# Patient Record
Sex: Female | Born: 1975 | Race: White | Hispanic: No | Marital: Single | Smoking: Never smoker
Health system: Southern US, Community
[De-identification: ages and names within clinical notes are randomized; demographics above are authoritative.]

## PROBLEM LIST (undated history)

## (undated) DIAGNOSIS — R42 Dizziness and giddiness: Secondary | ICD-10-CM

## (undated) DIAGNOSIS — N200 Calculus of kidney: Secondary | ICD-10-CM

---

## 2007-12-26 ENCOUNTER — Ambulatory Visit (HOSPITAL_COMMUNITY): Admission: RE | Admit: 2007-12-26 | Discharge: 2007-12-26 | Payer: Self-pay | Admitting: Obstetrics and Gynecology

## 2009-04-03 ENCOUNTER — Ambulatory Visit: Payer: Self-pay | Admitting: Family Medicine

## 2010-11-01 ENCOUNTER — Ambulatory Visit: Payer: Self-pay | Admitting: Emergency Medicine

## 2011-01-23 NOTE — Letter (Signed)
Summary: Out of Work  [facility]  [address]   [place], [place] [address]   Phone: [phone]  Fax: [phone]    November 01, 2010   Employee:  Chyann HACKWORTH    To Whom It May Concern:   For Medical reasons, please excuse the above named employee from work for the following dates:  Start:   11/01/10  End:   11/02/10  If you need additional information, please feel free to contact our office.         Sincerely,    Hoyt Koch MD

## 2011-01-23 NOTE — Assessment & Plan Note (Signed)
Summary: EARS POPPING, DIZZY/TJ   Vital Signs:  Patient Profile:   35 Years Old Female CC:      Dizzy,  nausea, sinus drainage, sore throat x 5 days Height:     62 inches O2 Sat:      100 % O2 treatment:    Room Air Temp:     97.6 degrees F oral Pulse rate:   77 / minute Pulse rhythm:   regular Resp:     14 per minute BP sitting:   131 / 86  (left arm) Cuff size:   regular  Vitals Entered By: Emilio Math (November 01, 2010 3:36 PM)                  Current Allergies (reviewed today): ! AMOXICILLIN ! * HAYFEVERHistory of Present Illness Chief Complaint: Dizzy,  nausea, sinus drainage, sore throat x 5 days History of Present Illness: URI symptoms for the last few days including sore throat, sinus drainage, cough.  Then today around lunchtime off of a sudden developed dizziness.  She has a history of vertigo and this feels very similar.  Worse when turning head and movement and bright lights.  Staying still helps.  She has taken Meclizine in the past that has helped.  +Nausea but no vomiting.  Also with a history of migraines and does have a HA today.  No F/C.  Not using any other OTC meds.  No numbness, weakness, tingling.  Current Meds IBUPROFEN 800 MG TABS (IBUPROFEN) 1 tab by mouth three times a day SUDAFED 30 MG TABS (PSEUDOEPHEDRINE HCL)  PROMETHAZINE HCL 12.5 MG TABS (PROMETHAZINE HCL) 1 tab by mouth Q6 hours as needed for nausea MECLIZINE HCL 25 MG TABS (MECLIZINE HCL) 1 tab by mouth Q6 hours as needed for dizziness  REVIEW OF SYSTEMS Constitutional Symptoms      Denies fever, chills, night sweats, weight loss, weight gain, and fatigue.  Eyes       Denies change in vision, eye pain, eye discharge, glasses, contact lenses, and eye surgery. Ear/Nose/Throat/Mouth       Complains of sinus problems.      Denies hearing loss/aids, change in hearing, ear pain, ear discharge, dizziness, frequent runny nose, frequent nose bleeds, sore throat, hoarseness, and tooth pain or  bleeding.  Respiratory       Denies dry cough, productive cough, wheezing, shortness of breath, asthma, bronchitis, and emphysema/COPD.  Cardiovascular       Denies murmurs, chest pain, and tires easily with exhertion.    Gastrointestinal       Complains of nausea/vomiting.      Denies stomach pain, diarrhea, constipation, blood in bowel movements, and indigestion. Genitourniary       Denies painful urination, kidney stones, and loss of urinary control. Neurological       Denies paralysis, seizures, and fainting/blackouts. Musculoskeletal       Denies muscle pain, joint pain, joint stiffness, decreased range of motion, redness, swelling, muscle weakness, and gout.  Skin       Denies bruising, unusual mles/lumps or sores, and hair/skin or nail changes.  Psych       Denies mood changes, temper/anger issues, anxiety/stress, speech problems, depression, and sleep problems.  Past History:  Past Medical History: Reviewed history from 04/03/2009 and no changes required. Unremarkable  Past Surgical History: Reviewed history from 04/03/2009 and no changes required. Denies surgical history  Family History: Reviewed history from 04/03/2009 and no changes required. 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 General appearance: well developed, well nourished, mild distress from photophobia Eyes: scleral irritation Ears: normal, no lesions or deformities Nasal: mucosa pink, nonedematous, no septal deviation, turbinates normal Oral/Pharynx: tongue normal, posterior pharynx without erythema or exudate Neck: neck supple,  trachea midline, no masses Chest/Lungs: no rales, wheezes, or rhonchi bilateral, breath sounds equal without effort Heart: regular rate and  rhythm, no  murmur Neurological: grossly intact and non-focal Skin: no obvious rashes or lesions MSE: oriented to time, place, and person Assessment New Problems: NAUSEA (ICD-787.02) BENIGN POSITIONAL VERTIGO (ICD-386.11)   Plan New Medications/Changes: MECLIZINE HCL 25 MG TABS (MECLIZINE HCL) 1 tab by mouth Q6 hours as needed for dizziness  #20 x 0, 11/01/2010, Hoyt Koch MD PROMETHAZINE HCL 12.5 MG TABS (PROMETHAZINE HCL) 1 tab by mouth Q6 hours as needed for nausea  #15 x 0, 11/01/2010, Hoyt Koch MD  New Orders: Est. Patient Level III [phone] Planning Comments:   Use the nausea meds and the Meclizine as instructed.  No ABX prescribed today since this was likely viral.  If getting worse, can then try Prednisone.  However, gave a work note for the patient to go home, rest, hydration.  Migraine meds as needed.  Follow-up with your primary care physician if not improving or if getting worse.  If much worse, go to ER   The patient and/or caregiver has been counseled thoroughly with regard to medications prescribed including dosage, schedule, interactions, rationale for use, and possible side effects and they verbalize understanding.  Diagnoses and expected course of recovery discussed and will return if not improved as expected or if the condition worsens. Patient and/or caregiver verbalized understanding.  Prescriptions: MECLIZINE HCL 25 MG TABS (MECLIZINE HCL) 1 tab by mouth Q6 hours as needed for dizziness  #20 x 0   Entered and Authorized by:   Hoyt Koch MD   Signed by:   Hoyt Koch MD on 11/01/2010   Method used:   Print then Give to Patient   RxID:   [id number] PROMETHAZINE HCL 12.5 MG TABS (PROMETHAZINE HCL) 1 tab by mouth Q6 hours as needed for nausea  #15 x 0   Entered and Authorized by:   Hoyt Koch MD   Signed by:   Hoyt Koch MD on 11/01/2010   Method used:   Print then Give to Patient   RxID:   [id number]   Orders Added: 1)   Est. Patient Level III [32671]

## 2011-03-11 ENCOUNTER — Ambulatory Visit
Admission: RE | Admit: 2011-03-11 | Discharge: 2011-03-11 | Disposition: A | Discharge status: Home / Self Care | Payer: BC Managed Care – PPO | Source: Ambulatory Visit | Attending: Family Medicine | Admitting: Family Medicine

## 2011-03-11 ENCOUNTER — Encounter: Payer: Self-pay | Admitting: Family Medicine

## 2011-03-11 ENCOUNTER — Other Ambulatory Visit: Payer: Self-pay | Admitting: Family Medicine

## 2011-03-11 ENCOUNTER — Inpatient Hospital Stay (INDEPENDENT_AMBULATORY_CARE_PROVIDER_SITE_OTHER)
Admission: RE | Admit: 2011-03-11 | Discharge: 2011-03-11 | Disposition: A | Discharge status: Home / Self Care | Payer: BC Managed Care – PPO | Source: Ambulatory Visit | Attending: Family Medicine | Admitting: Family Medicine

## 2011-03-11 DIAGNOSIS — M79673 Pain in unspecified foot: Secondary | ICD-10-CM

## 2011-03-11 DIAGNOSIS — M21959 Unspecified acquired deformity of unspecified thigh: Secondary | ICD-10-CM

## 2011-03-11 DIAGNOSIS — M7750 Other enthesopathy of unspecified foot: Secondary | ICD-10-CM

## 2011-03-11 DIAGNOSIS — M79609 Pain in unspecified limb: Secondary | ICD-10-CM

## 2011-03-22 NOTE — Assessment & Plan Note (Signed)
Summary: foot injury/TM rm 4   Vital Signs:  Patient Profile:   35 Years Old Female CC:      LT foot injury Height:     62 inches Weight:      136.50 pounds O2 Sat:      100 % O2 treatment:    Room Air Temp:     99.0 degrees F oral Pulse rate:   82 / minute Resp:     16 per minute BP sitting:   126 / 81  (left arm) Cuff size:   regular  Vitals Entered By: Clemens Catholic LPN (March 11, 2011 11:27 AM)                  Updated Prior Medication List: No Medications Current Allergies (reviewed today): ! AMOXICILLIN ! * HAYFEVERHistory of Present Illness Chief Complaint: LT foot injury History of Present Illness:  Subjective:  Patient complains of two day history of pain in lateral edge of left foot, worse with ambulation/weight bearing.  Recalls no injury, but has been doing yoga.  Poor response to Ibuprofen.  REVIEW OF SYSTEMS Constitutional Symptoms      Denies fever, chills, night sweats, weight loss, weight gain, and fatigue.  Eyes       Denies change in vision, eye pain, eye discharge, glasses, contact lenses, and eye surgery. Ear/Nose/Throat/Mouth       Denies hearing loss/aids, change in hearing, ear pain, ear discharge, dizziness, frequent runny nose, frequent nose bleeds, sinus problems, sore throat, hoarseness, and tooth pain or bleeding.  Respiratory       Denies dry cough, productive cough, wheezing, shortness of breath, asthma, bronchitis, and emphysema/COPD.  Cardiovascular       Denies murmurs, chest pain, and tires easily with exhertion.    Gastrointestinal       Denies stomach pain, nausea/vomiting, diarrhea, constipation, blood in bowel movements, and indigestion. Genitourniary       Denies painful urination, kidney stones, and loss of urinary control. Neurological       Denies paralysis, seizures, and fainting/blackouts. Musculoskeletal       Complains of decreased range of motion.      Denies muscle pain, joint pain, joint stiffness, redness,  swelling, muscle weakness, and gout.  Skin       Complains of bruising.      Denies unusual mles/lumps or sores and hair/skin or nail changes.  Psych       Denies mood changes, temper/anger issues, anxiety/stress, speech problems, depression, and sleep problems. Other Comments: pt c/o LT foot pain and bruising x 2days. Hurts to bare weight on that foot. no injury. she did start doing Yoga 2 wks ago. she taken IBF  and applied ice with no relief.   Past History:  Past Medical History: Reviewed history from 04/03/2009 and no changes required. Unremarkable  Past Surgical History: Reviewed history from 04/03/2009 and no changes required. Denies surgical history  Family History: Reviewed history from 04/03/2009 and no changes required. mother age 28 alive - high blood pressure - high cholesterol father age 27 alive - high blood pressure - high cholesterol brother age 74 alive and healthy  Social History: Reviewed history from 11/01/2010 and no changes required. 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  No ecchymosis.  Vague mild tenderness over 5th metatarsal.  All toes have full range  of motion.  Distal neurovascular intact  X-ray left foot:  IMPRESSION: No acute bony pathology. Assessment New Problems: METATARSALGIA (ICD-726.70) FOOT PAIN, LEFT (ICD-729.5)   Plan New Orders: T-DG Foot Complete*L* [73630] Est. Patient Level III [60737] Planning Comments:   Recommend Ibuprofen 200mg , 4 tabs every 8 hours with food.  Obtain well fitting arch supports for shoes.  ([facility] information and instruction patient handout given)  Followup with [facility] if not improved in two weeks.   The patient and/or caregiver has been counseled thoroughly with regard to medications prescribed including dosage, schedule,  interactions, rationale for use, and possible side effects and they verbalize understanding.  Diagnoses and expected course of recovery discussed and will return if not improved as expected or if the condition worsens. Patient and/or caregiver verbalized understanding.   Orders Added: 1)  T-DG Foot Complete*L* [73630] 2)  Est. Patient Level III [10626]

## 2011-07-22 ENCOUNTER — Other Ambulatory Visit: Payer: Self-pay | Admitting: Emergency Medicine

## 2011-07-22 ENCOUNTER — Inpatient Hospital Stay (INDEPENDENT_AMBULATORY_CARE_PROVIDER_SITE_OTHER)
Admission: RE | Admit: 2011-07-22 | Discharge: 2011-07-22 | Disposition: A | Discharge status: Home / Self Care | Payer: BC Managed Care – PPO | Source: Ambulatory Visit | Attending: Emergency Medicine | Admitting: Emergency Medicine

## 2011-07-22 ENCOUNTER — Encounter: Payer: Self-pay | Admitting: Emergency Medicine

## 2011-07-22 ENCOUNTER — Ambulatory Visit
Admission: RE | Admit: 2011-07-22 | Discharge: 2011-07-22 | Disposition: A | Discharge status: Home / Self Care | Payer: BC Managed Care – PPO | Source: Ambulatory Visit | Attending: Emergency Medicine | Admitting: Emergency Medicine

## 2011-07-22 DIAGNOSIS — M25529 Pain in unspecified elbow: Secondary | ICD-10-CM

## 2011-11-17 ENCOUNTER — Emergency Department
Admit: 2011-11-17 | Discharge: 2011-11-17 | Disposition: A | Discharge status: Home / Self Care | Payer: BC Managed Care – PPO

## 2011-11-17 ENCOUNTER — Emergency Department
Admission: EM | Admit: 2011-11-17 | Discharge: 2011-11-17 | Disposition: A | Discharge status: Home / Self Care | Payer: BC Managed Care – PPO | Source: Home / Self Care | Attending: Family Medicine | Admitting: Family Medicine

## 2011-11-17 DIAGNOSIS — R05 Cough: Secondary | ICD-10-CM

## 2011-11-17 DIAGNOSIS — J209 Acute bronchitis, unspecified: Secondary | ICD-10-CM

## 2011-11-17 DIAGNOSIS — R058 Other specified cough: Secondary | ICD-10-CM

## 2011-11-17 MED ORDER — GUAIFENESIN-CODEINE 100-10 MG/5ML PO SYRP: ORAL_SOLUTION | ORAL | Status: AC

## 2011-11-17 MED ORDER — CLARITHROMYCIN 500 MG PO TABS: 500.0000 mg | ORAL_TABLET | Freq: Two times a day (BID) | ORAL | Status: AC

## 2011-11-17 NOTE — ED Notes (Signed)
States she has had a cough since 10/28/2011 now is having nasal congestion.

## 2011-11-17 NOTE — ED Provider Notes (Signed)
History     CSN: [id number] Arrival date & time: 11/17/2011  2:21 PM   First MD Initiated Contact with Patient 11/17/11 1432      Chief Complaint  Patient presents with  . URI      HPI Comments: Patient complains of mild cold like symptoms that started about a month ago, and now has developed a persistent non-productive cough.  She often coughs until she gags.  Yesterday she developed increased sinus congestion with chills/sweats.  She has had increased anterior chest discomfort over the past two days.  Patient is a 35 y.o. female presenting with cough. The history is provided by the patient.  Cough This is a new problem. The current episode started more than 1 week ago. The problem occurs hourly. The problem has been gradually worsening. The cough is non-productive. There has been no fever. Associated symptoms include chills, sweats, weight loss and rhinorrhea. Pertinent negatives include no chest pain, no ear congestion, no ear pain, no headaches, no sore throat, no myalgias, no shortness of breath, no wheezing and no eye redness. She has tried cough syrup for the symptoms. The treatment provided no relief. She is not a smoker.    History reviewed. No pertinent past medical history.  History reviewed. No pertinent past surgical history.  Family History  Problem Relation Age of Onset  . Diabetes Other   . Hypertension Other     History  Substance Use Topics  . Smoking status: Never Smoker   . Smokeless tobacco: Not on file  . Alcohol Use: No    OB History    Grav Para Term Preterm Abortions TAB SAB Ect Mult Living                  Review of Systems  Constitutional: Positive for fever, chills, weight loss, fatigue and unexpected weight change.  HENT: Positive for congestion, rhinorrhea and postnasal drip. Negative for ear pain, sore throat, neck stiffness, sinus pressure and ear discharge.   Eyes: Negative for redness.  Respiratory: Positive for cough and chest  tightness. Negative for shortness of breath and wheezing.   Cardiovascular: Negative for chest pain and leg swelling.  Gastrointestinal: Negative.   Genitourinary: Negative.   Musculoskeletal: Negative for myalgias.  Skin: Negative.   Neurological: Negative for headaches.    Allergies  Amoxicillin  Home Medications   Current Outpatient Rx  Name Route Sig Dispense Refill  . CLARITHROMYCIN 500 MG PO TABS Oral Take 1 tablet (500 mg total) by mouth 2 (two) times daily. 20 tablet 0  . GUAIFENESIN-CODEINE 100-10 MG/5ML PO SYRP  Take 5 to 10 ML by mouth at bedtime as needed for cough 120 mL 0    BP 115/82  Pulse 91  Temp(Src) 98.3 F (36.8 C) (Oral)  Resp 20  Ht 5\' 2"  (1.575 m)  Wt 129 lb (58.514 kg)  BMI 23.59 kg/m2  SpO2 97%  LMP 10/13/2011  Physical Exam  Nursing note and vitals reviewed. Constitutional: She is oriented to person, place, and time. She appears well-developed and well-nourished. No distress.  HENT:  Head: Normocephalic and atraumatic.  Right Ear: Tympanic membrane and external ear normal.  Left Ear: Tympanic membrane and external ear normal.  Nose: Nose normal.  Mouth/Throat: Oropharynx is clear and moist. No oropharyngeal exudate.  Eyes: Conjunctivae and EOM are normal. Pupils are equal, round, and reactive to light. Right eye exhibits no discharge. Left eye exhibits no discharge. No scleral icterus.  Neck: Neck supple.  Cardiovascular:  Normal rate, regular rhythm and normal heart sounds.   Pulmonary/Chest: Effort normal. No respiratory distress. She has no wheezes. She has rhonchi. She has no rales. She exhibits no tenderness.  Abdominal: Bowel sounds are normal. There is no tenderness.  Musculoskeletal: She exhibits no edema and no tenderness.  Lymphadenopathy:    She has no cervical adenopathy.  Neurological: She is alert and oriented to person, place, and time.  Skin: Skin is warm and dry.    ED Course  Procedures None  Labs Reviewed - No data to  display Dg Chest 2 View  11/17/2011  *RADIOLOGY REPORT*  Clinical Data: Persistent cough for 1 month.  Nonsmoker.  CHEST - 2 VIEW  Comparison: None.  Findings: Lateral view degraded by patient arm position.  Midline trachea.  Normal heart size and mediastinal contours. No pleural effusion or pneumothorax.  Clear lungs.  IMPRESSION: Normal chest.  Original Report Authenticated By: Consuello Bossier, M.D.     1. Acute bronchitis       MDM  ? Pertussis Begin Biaxin.  Begin expectorant/decongestant, topical decongestant, saline nasal spray and/or saline irrigation, and cough suppressant at bedtime.  Avoid antihistamines for now. Increase fluid intake, rest. Recommend Tdap and flu shot when well. Followup with PCP if not improving one week.          Donna Christen, MD 11/20/11 [phone]

## 2011-11-26 NOTE — Progress Notes (Signed)
Summary: elbow injury/TM(RM4   Vital Signs:  Patient Profile:   35 Years Old Female CC:      ELBOW INJURY Height:     62 inches Weight:      139 pounds O2 Sat:      100 % O2 treatment:    Room Air Temp:     98.5 degrees F oral Pulse rate:   74 / minute Resp:     16 per minute BP sitting:   128 / 88  (left arm) Cuff size:   regular  Vitals Entered By: Linton Flemings RN (July 22, 2011 5:45 PM)                  Updated Prior Medication List: No Medications Current Allergies (reviewed today): ! AMOXICILLIN ! * HAYFEVERHistory of Present Illness History from: patient Chief Complaint: ELBOW INJURY History of Present Illness: R elbow injury.  She is R handed.  She fell 2 days ago directly onto her elbow.  Since then has had pain and swelling.  Movement is still good but she reports tenderness to touch and swelling.  Ice is helping some.  No previous elbow injuries.  Pain is constant and throbbing, worse with movement.  Mild radiation to forearm yesterday but today is better.  REVIEW OF SYSTEMS Constitutional Symptoms      Denies fever, chills, night sweats, weight loss, weight gain, and fatigue.  Eyes       Denies change in vision, eye pain, eye discharge, glasses, contact lenses, and eye surgery. Ear/Nose/Throat/Mouth       Denies hearing loss/aids, change in hearing, ear pain, ear discharge, dizziness, frequent runny nose, frequent nose bleeds, sinus problems, sore throat, hoarseness, and tooth pain or bleeding.  Respiratory       Denies dry cough, productive cough, wheezing, shortness of breath, asthma, bronchitis, and emphysema/COPD.  Cardiovascular       Denies murmurs, chest pain, and tires easily with exhertion.    Gastrointestinal       Denies stomach pain, nausea/vomiting, diarrhea, constipation, blood in bowel movements, and indigestion. Genitourniary       Denies painful urination, kidney stones, and loss of urinary control. Neurological       Denies paralysis,  seizures, and fainting/blackouts. Musculoskeletal       Complains of decreased range of motion.      Denies muscle pain, joint pain, joint stiffness, redness, swelling, muscle weakness, and gout.  Skin       Complains of bruising.      Denies unusual mles/lumps or sores and hair/skin or nail changes.  Psych       Denies mood changes, temper/anger issues, anxiety/stress, speech problems, depression, and sleep problems. Other Comments: FELL ON FRIDAY, RIGHT ELBOW PAIN   Past History:  Past Medical History: Reviewed history from 04/03/2009 and no changes required. Unremarkable  Past Surgical History: Reviewed history from 04/03/2009 and no changes required. Denies surgical history  Family History: Reviewed history from 04/03/2009 and no changes required. mother age 41 alive - high blood pressure - high cholesterol father age 26 alive - high blood pressure - high cholesterol brother age 96 alive and healthy  Social History: Reviewed history from 11/01/2010 and no changes required. 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  Supination and pronation are slightly reduced as well.  There is a good amount of swelling/effusion throughout elbow joint.  She has no anterior or medial pain, but she does have significant tenderness posteriorly and lateral epicondyle.  Distal NV status intact. Assessment New Problems: ELBOW PAIN (ICD-719.42)   Plan New Orders: Est. Patient Level IV [14782] T-DG Elbow Complete*R* [73080] Arm Sling Universal [A4565] Planning Comments:   Elbow Xray is ordered and read by radiology as normal.  Given sling to wear.  From the exam, I was expecting a fracture, but instead is likely a bony contusion, r/o occult fracture.  If not improving with conservative  treatment in the next week, will refer her to sports medicine for follow up.  Encourage ice, OTC Motrin, rest, elevation.    The patient and/or caregiver has been counseled thoroughly with regard to medications prescribed including dosage, schedule, interactions, rationale for use, and possible side effects and they verbalize understanding.  Diagnoses and expected course of recovery discussed and will return if not improved as expected or if the condition worsens. Patient and/or caregiver verbalized understanding.   Orders Added: 1)  Est. Patient Level IV [95621] 2)  T-DG Elbow Complete*R* [73080] 3)  Arm Sling Universal [A4565]

## 2011-11-29 ENCOUNTER — Other Ambulatory Visit (HOSPITAL_COMMUNITY)
Admission: RE | Admit: 2011-11-29 | Discharge: 2011-11-29 | Disposition: A | Discharge status: Home / Self Care | Payer: BC Managed Care – PPO | Source: Ambulatory Visit | Attending: Obstetrics and Gynecology | Admitting: Obstetrics and Gynecology

## 2011-11-29 DIAGNOSIS — Z1159 Encounter for screening for other viral diseases: Secondary | ICD-10-CM | POA: Insufficient documentation

## 2011-11-29 DIAGNOSIS — Z124 Encounter for screening for malignant neoplasm of cervix: Secondary | ICD-10-CM | POA: Insufficient documentation

## 2011-11-29 DIAGNOSIS — Z113 Encounter for screening for infections with a predominantly sexual mode of transmission: Secondary | ICD-10-CM | POA: Insufficient documentation

## 2012-09-19 IMAGING — CR DG ELBOW COMPLETE 3+V*R*
4 series · 4 of 4 positions shown · non-contrast
Comparison: None.

CLINICAL DATA: Pain secondary to blunt trauma.

RIGHT ELBOW - COMPLETE 3+ VIEW

[view not recorded (1 of 4)]
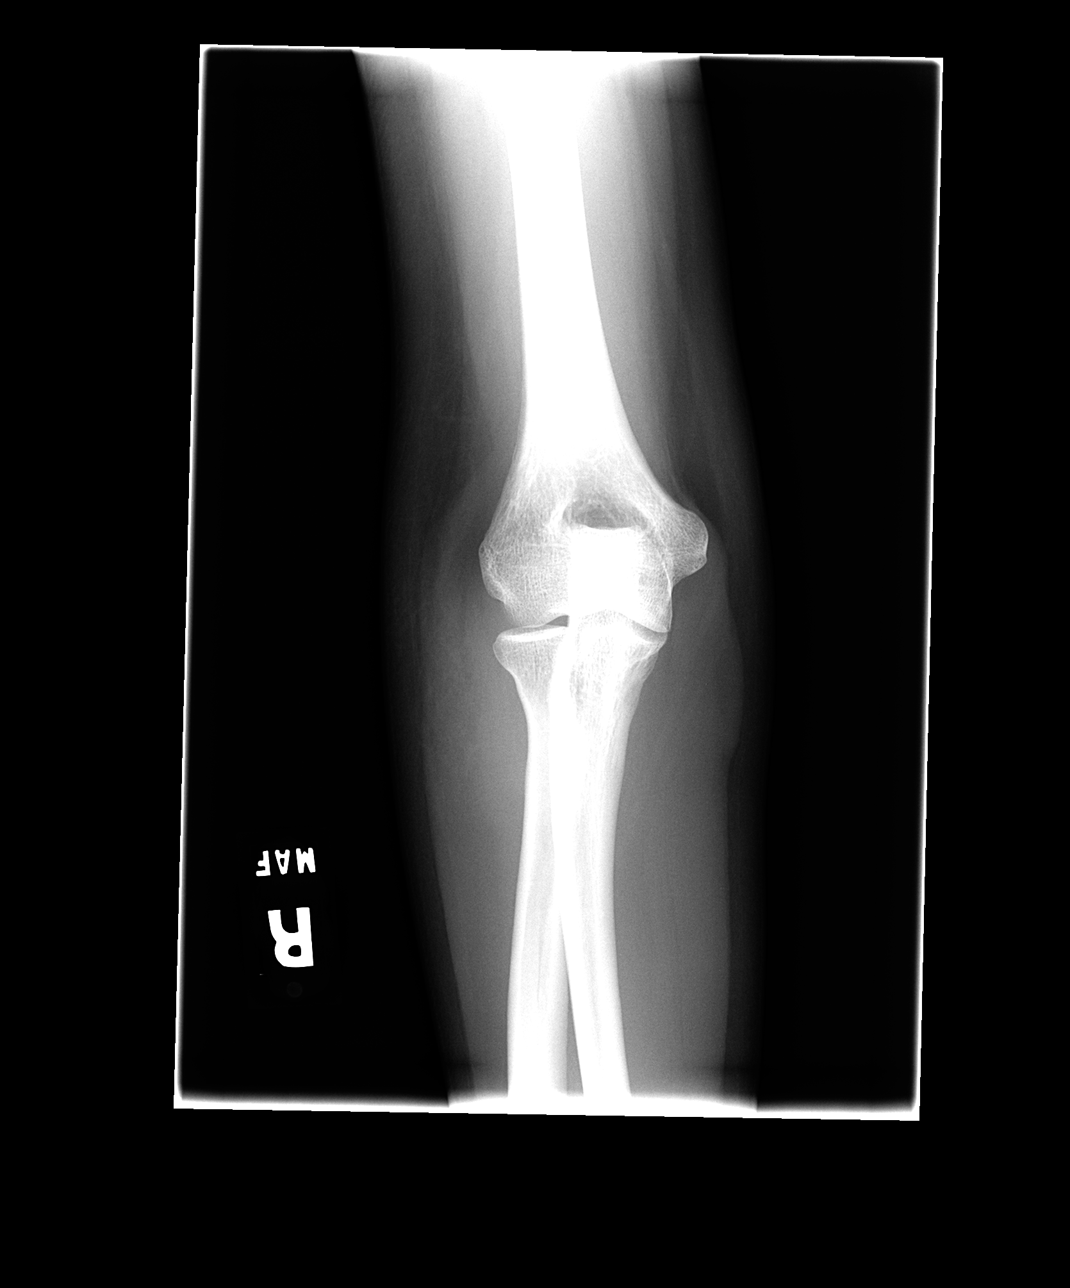

[view not recorded (2 of 4)]
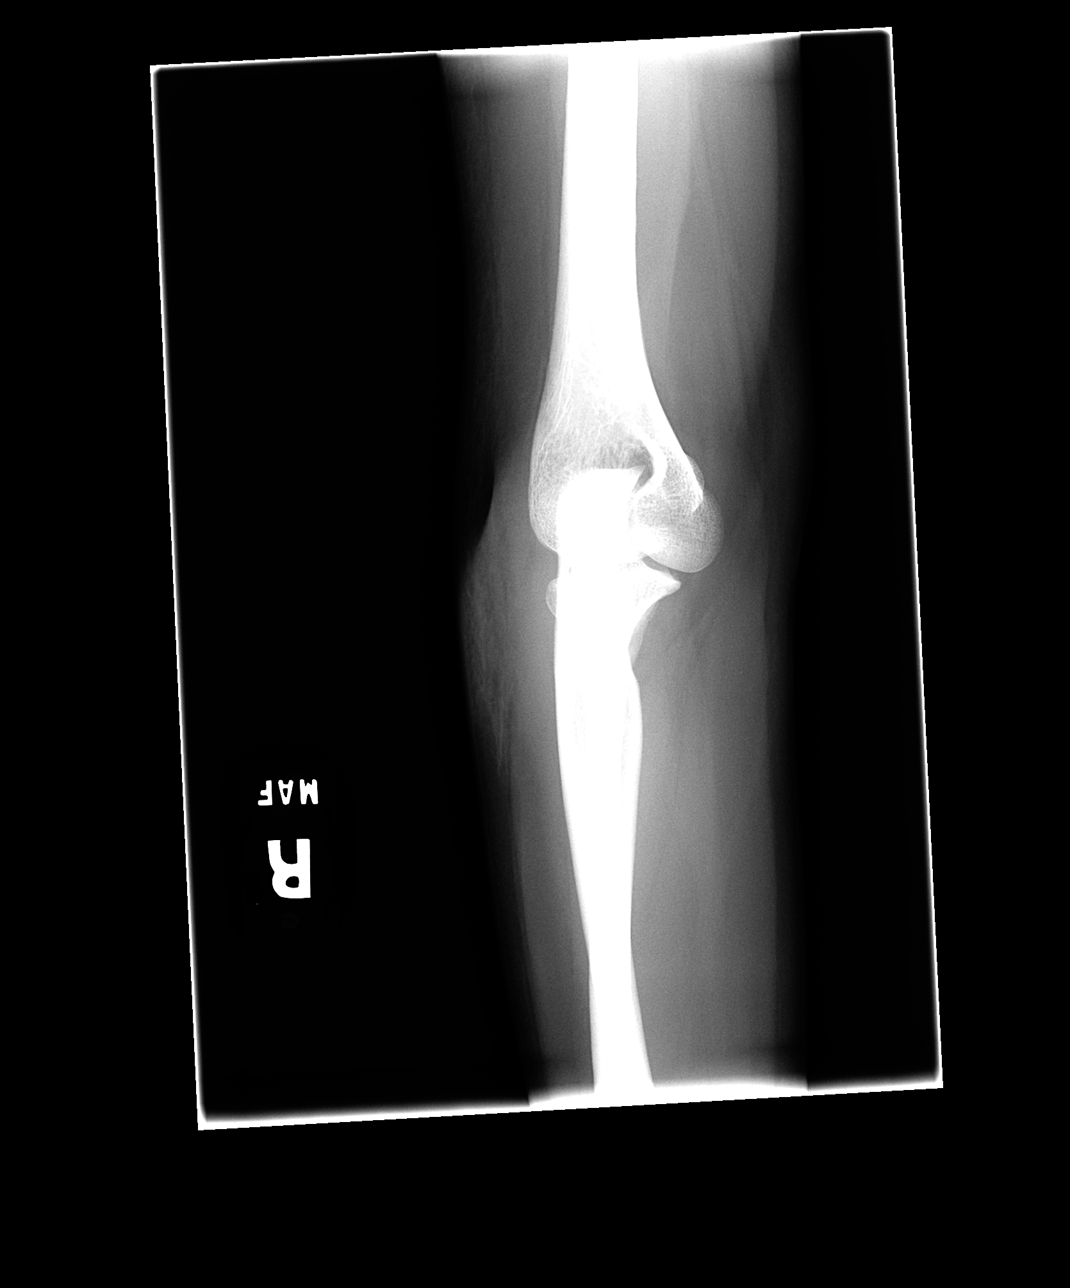

[view not recorded (3 of 4)]
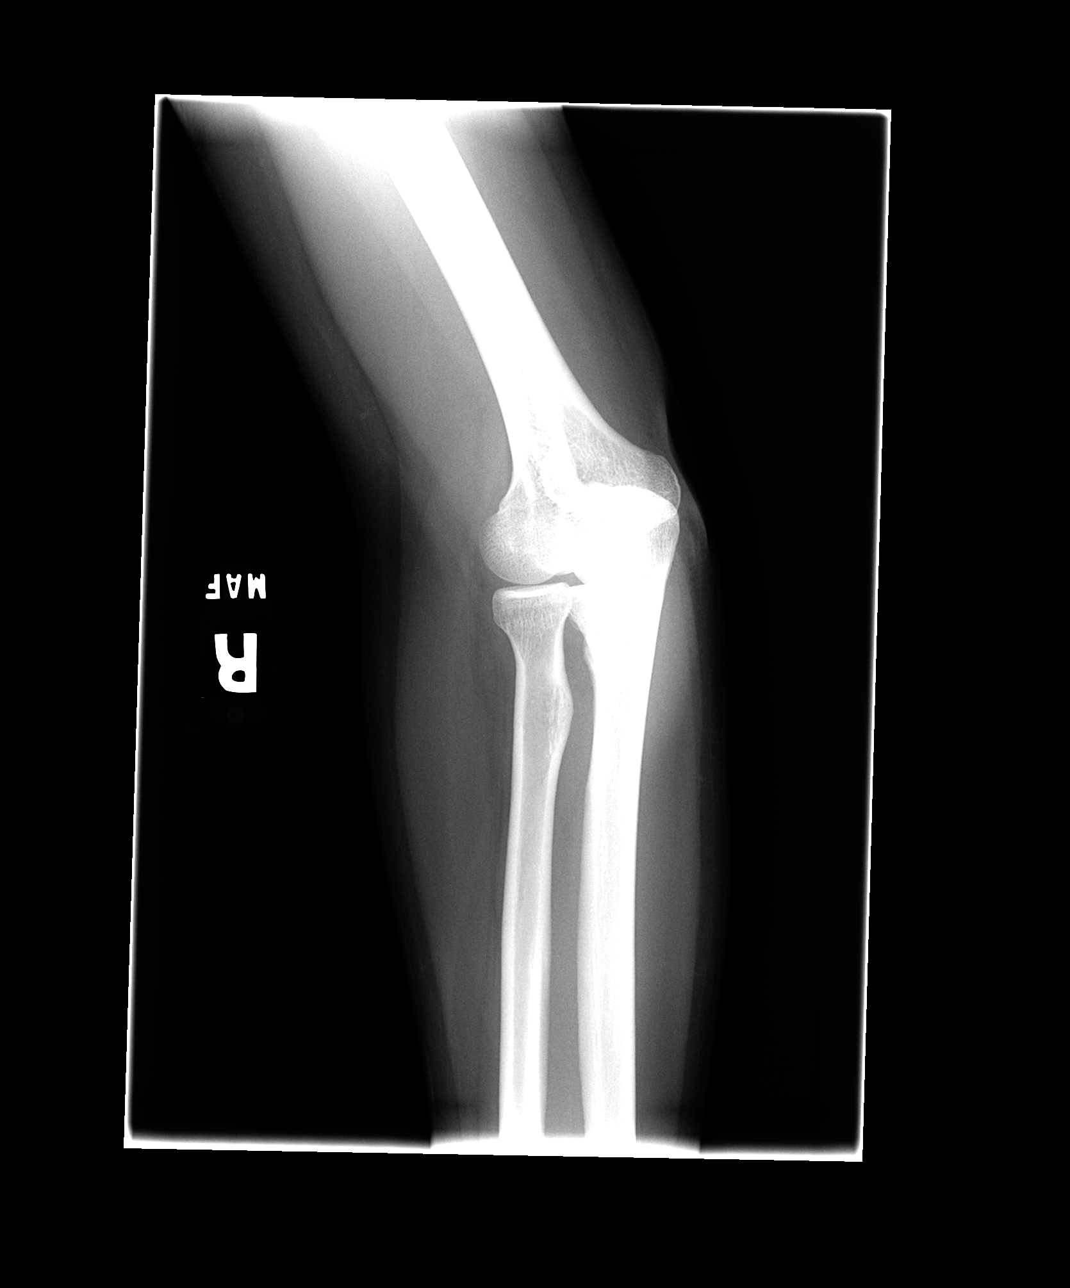

[view not recorded (4 of 4)]
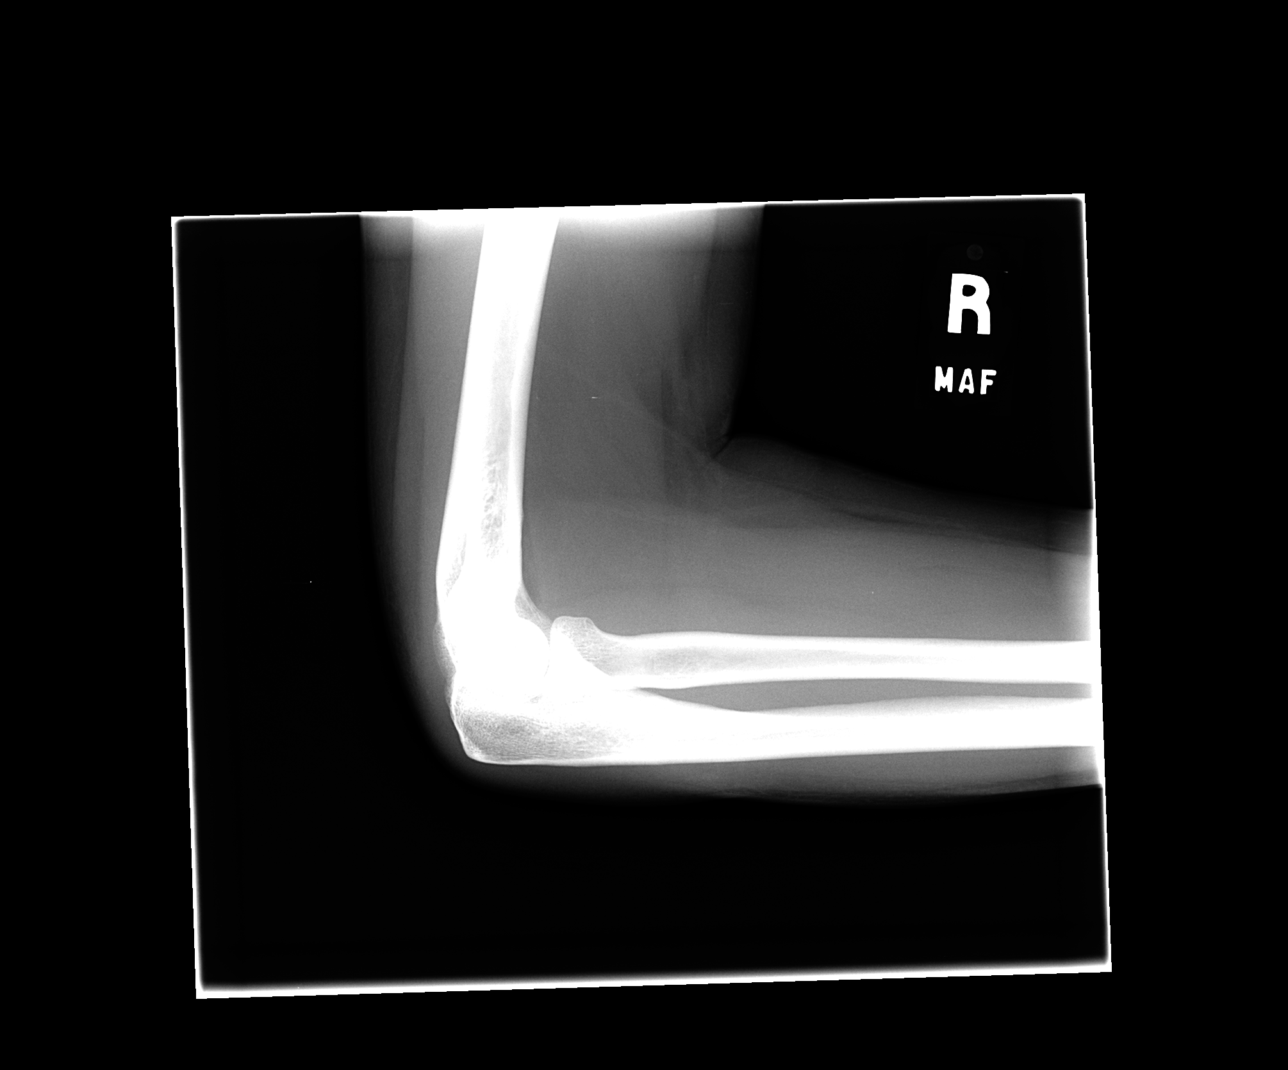

[4 of 4 positions shown; findings below may reference images not displayed]

FINDINGS: No fracture, dislocation, or other significant
abnormality.  No joint effusion.
IMPRESSION: Normal exam.

## 2013-01-15 IMAGING — CR DG CHEST 2V
2 series · 2 of 2 positions shown · non-contrast
Comparison: None.

CLINICAL DATA: Persistent cough for 1 month.  Nonsmoker.

CHEST - 2 VIEW

[view not recorded (1 of 2)]
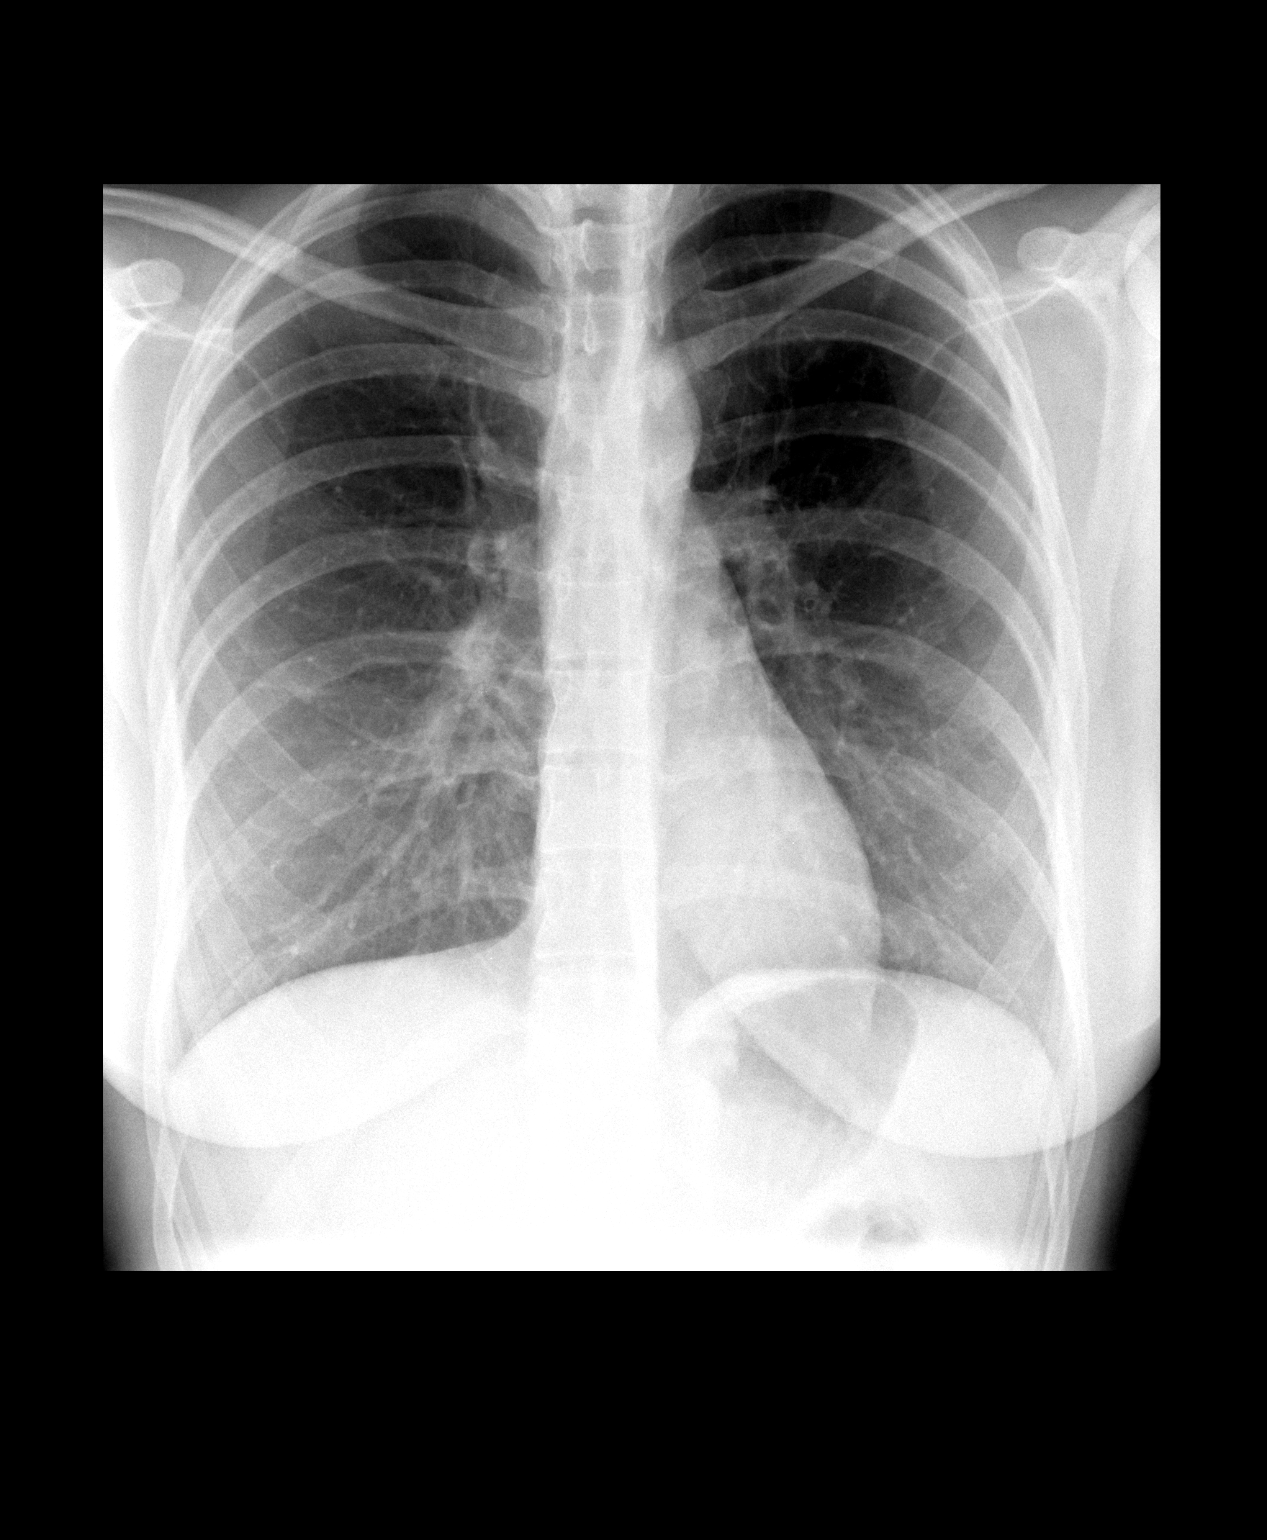

[view not recorded (2 of 2)]
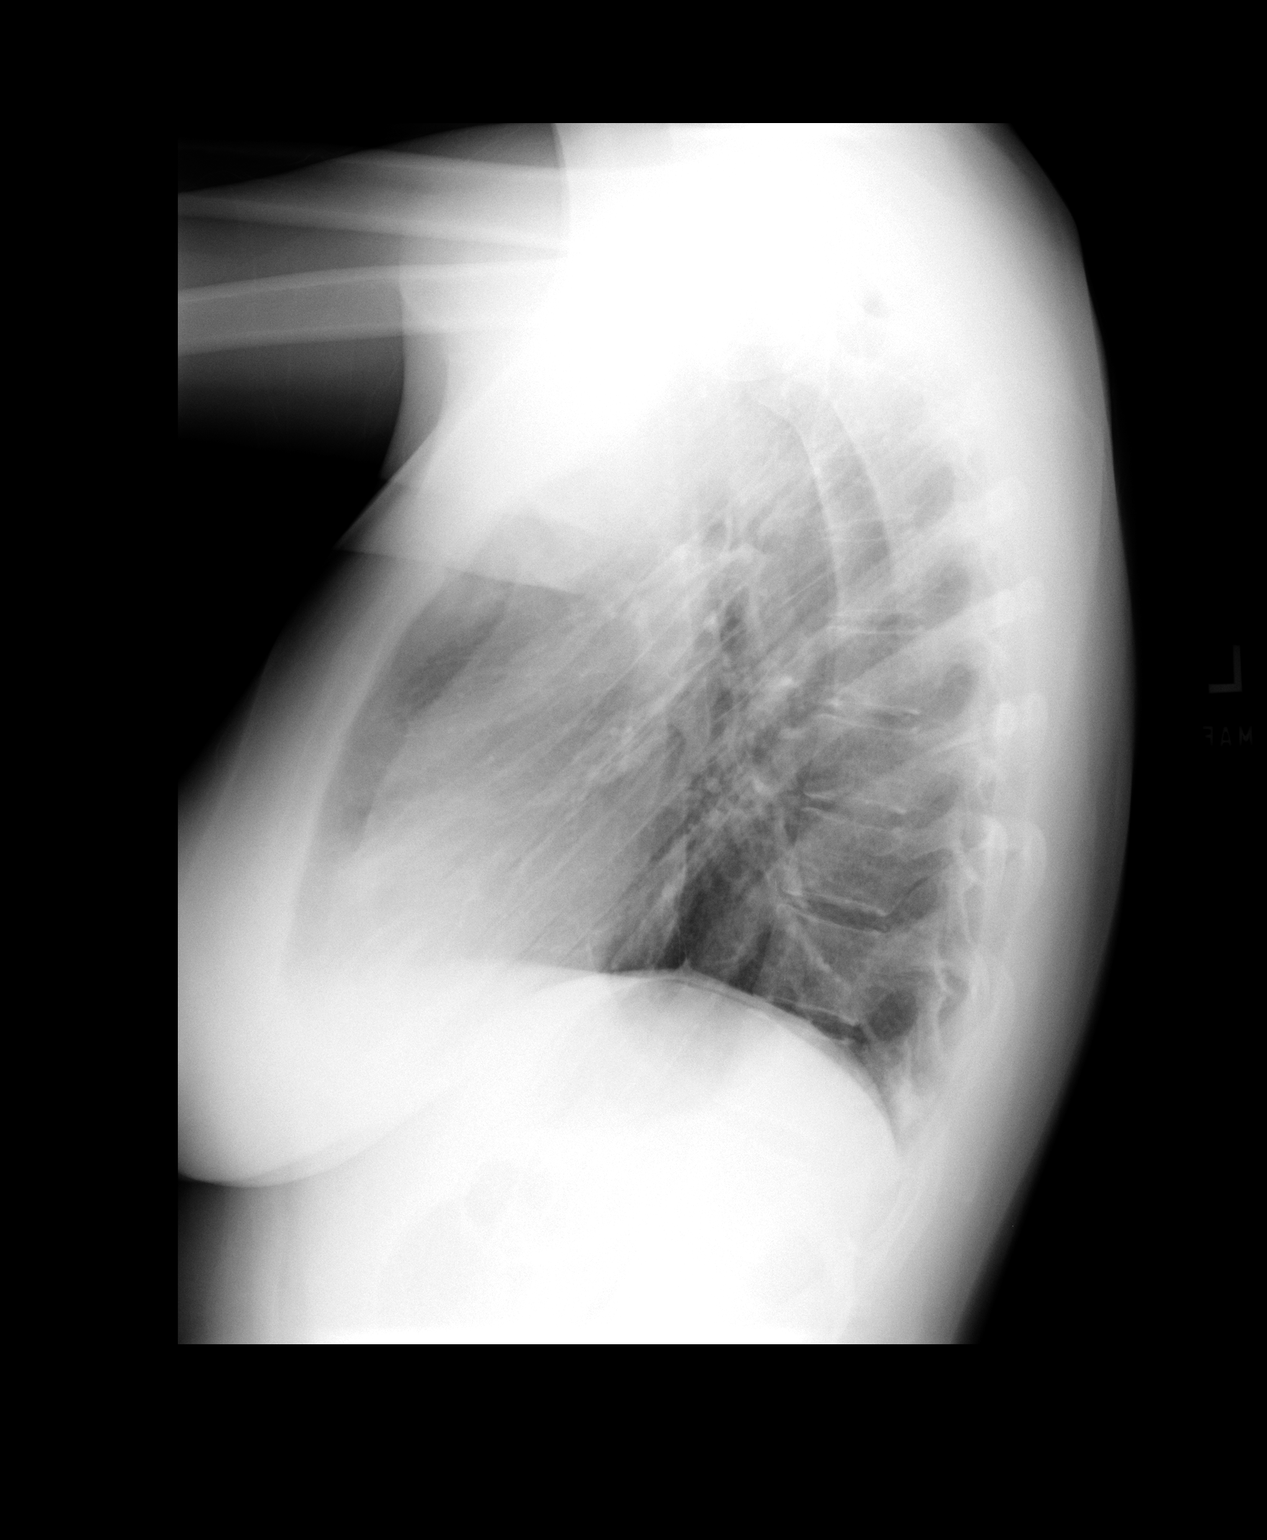

[2 of 2 positions shown; findings below may reference images not displayed]

FINDINGS: Lateral view degraded by patient arm position.

Midline trachea.  Normal heart size and mediastinal contours. No
pleural effusion or pneumothorax.  Clear lungs.
IMPRESSION: Normal chest.

## 2016-02-13 ENCOUNTER — Encounter: Payer: Self-pay | Admitting: *Deleted

## 2016-02-13 ENCOUNTER — Emergency Department
Admission: EM | Admit: 2016-02-13 | Discharge: 2016-02-13 | Disposition: A | Discharge status: Home / Self Care | Payer: PRIVATE HEALTH INSURANCE | Source: Home / Self Care | Attending: Family Medicine | Admitting: Family Medicine

## 2016-02-13 DIAGNOSIS — H6123 Impacted cerumen, bilateral: Secondary | ICD-10-CM

## 2016-02-13 DIAGNOSIS — H8110 Benign paroxysmal vertigo, unspecified ear: Secondary | ICD-10-CM

## 2016-02-13 DIAGNOSIS — H9202 Otalgia, left ear: Secondary | ICD-10-CM | POA: Diagnosis not present

## 2016-02-13 HISTORY — DX: Dizziness and giddiness: R42

## 2016-02-13 MED ORDER — ONDANSETRON 4 MG PO TBDP
4.0000 mg | ORAL_TABLET | Freq: Once | ORAL | Status: AC
  Administered 2016-02-13: 4 mg via ORAL

## 2016-02-13 NOTE — Discharge Instructions (Signed)
Benign Positional Vertigo Vertigo is the feeling that you or your surroundings are moving when they are not. Benign positional vertigo is the most common form of vertigo. The cause of this condition is not serious (is benign). This condition is triggered by certain movements and positions (is positional). This condition can be dangerous if it occurs while you are doing something that could endanger you or others, such as driving.  CAUSES In many cases, the cause of this condition is not known. It may be caused by a disturbance in an area of the inner ear that helps your brain to sense movement and balance. This disturbance can be caused by a viral infection (labyrinthitis), head injury, or repetitive motion. RISK FACTORS This condition is more likely to develop in:  Women.  People who are 50 years of age or older. SYMPTOMS Symptoms of this condition usually happen when you move your head or your eyes in different directions. Symptoms may start suddenly, and they usually last for less than a minute. Symptoms may include:  Loss of balance and falling.  Feeling like you are spinning or moving.  Feeling like your surroundings are spinning or moving.  Nausea and vomiting.  Blurred vision.  Dizziness.  Involuntary eye movement (nystagmus). Symptoms can be mild and cause only slight annoyance, or they can be severe and interfere with daily life. Episodes of benign positional vertigo may return (recur) over time, and they may be triggered by certain movements. Symptoms may improve over time. DIAGNOSIS This condition is usually diagnosed by medical history and a physical exam of the head, neck, and ears. You may be referred to a health care provider who specializes in ear, nose, and throat (ENT) problems (otolaryngologist) or a provider who specializes in disorders of the nervous system (neurologist). You may have additional testing, including:  MRI.  A CT scan.  Eye movement tests. Your  health care provider may ask you to change positions quickly while he or she watches you for symptoms of benign positional vertigo, such as nystagmus. Eye movement may be tested with an electronystagmogram (ENG), caloric stimulation, the Dix-Hallpike test, or the roll test.  An electroencephalogram (EEG). This records electrical activity in your brain.  Hearing tests. TREATMENT Usually, your health care provider will treat this by moving your head in specific positions to adjust your inner ear back to normal. Surgery may be needed in severe cases, but this is rare. In some cases, benign positional vertigo may resolve on its own in 2-4 weeks. HOME CARE INSTRUCTIONS Safety  Move slowly.Avoid sudden body or head movements.  Avoid driving.  Avoid operating heavy machinery.  Avoid doing any tasks that would be dangerous to you or others if a vertigo episode would occur.  If you have trouble walking or keeping your balance, try using a cane for stability. If you feel dizzy or unstable, sit down right away.  Return to your normal activities as told by your health care provider. Ask your health care provider what activities are safe for you. General Instructions  Take over-the-counter and prescription medicines only as told by your health care provider.  Avoid certain positions or movements as told by your health care provider.  Drink enough fluid to keep your urine clear or pale yellow.  Keep all follow-up visits as told by your health care provider. This is important. SEEK MEDICAL CARE IF:  You have a fever.  Your condition gets worse or you develop new symptoms.  Your family or friends   notice any behavioral changes.  Your nausea or vomiting gets worse.  You have numbness or a "pins and needles" sensation. SEEK IMMEDIATE MEDICAL CARE IF:  You have difficulty speaking or moving.  You are always dizzy.  You faint.  You develop severe headaches.  You have weakness in your  legs or arms.  You have changes in your hearing or vision.  You develop a stiff neck.  You develop sensitivity to light.   This information is not intended to replace advice given to you by your health care provider. Make sure you discuss any questions you have with your health care provider.   Document Released: 09/17/2006 Document Revised: 08/31/2015 Document Reviewed: 04/04/2015 Elsevier Interactive Patient Education 2016 [employer]  

## 2016-02-13 NOTE — ED Provider Notes (Signed)
CSN: [id number]     Arrival date & time 02/13/16  4098 History   First MD Initiated Contact with Patient 02/13/16 0845     Chief Complaint  Patient presents with  . Otalgia   (Consider location/radiation/quality/duration/timing/severity/associated sxs/prior Treatment) HPI The pt is a 40yo female with hx of vertigo, presenting to [facility] with c/o dizziness and Left ear pain.  Dizziness described as "room spinning" similar to prior episodes of vertigo started about 3 days ago but Left ear pain started last night.  She has been able to control the vertigo with OTC Dramamine but she became concerned when ear pain started. She states its aching and sore but also feels clogged.  Pain is mild to moderate in severity. Mild intermittent headaches. Denies change in vision, cough, SOB, or nausea. Denies fever or chills. No Dramamine or other OTC medications taken today. Pt denies dizziness at this time.   Past Medical History  Diagnosis Date  . Vertigo    History reviewed. No pertinent past surgical history. Family History  Problem Relation Age of Onset  . Diabetes Other   . Hypertension Other   . Hypertension Mother   . Hyperlipidemia Mother   . Hyperlipidemia Father   . Hypertension Father    Social History  Substance Use Topics  . Smoking status: Never Smoker   . Smokeless tobacco: None  . Alcohol Use: No   OB History    No data available     Review of Systems  Constitutional: Negative for fever and chills.  HENT: Positive for congestion, ear pain (Left), hearing loss ( "clogged"), rhinorrhea ( minimal) and sore throat (mild scratchy). Negative for ear discharge, sinus pressure and sneezing.   Eyes: Negative for photophobia, pain and visual disturbance.  Respiratory: Negative for cough and shortness of breath.   Gastrointestinal: Negative for nausea, vomiting, abdominal pain and diarrhea.  Musculoskeletal: Negative for neck pain and neck stiffness.  Neurological: Positive for dizziness  and headaches. Negative for syncope and light-headedness.    Allergies  Amoxicillin  Home Medications   Prior to Admission medications   Medication Sig Start Date End Date Taking? Authorizing Provider  dimenhyDRINATE (DRAMAMINE) 50 MG tablet Take 50 mg by mouth every 8 (eight) hours as needed.   Yes Historical Provider, MD   Meds Ordered and Administered this Visit   Medications  ondansetron (ZOFRAN-ODT) disintegrating tablet 4 mg (not administered)    BP 131/94 mmHg  Pulse 93  Temp(Src) 97.5 F (36.4 C) (Oral)  Resp 16  Ht  (1.575 m)  Wt 141 lb (63.957 kg)  BMI 25.78 kg/m2  SpO2 100%  LMP 02/06/2016 No data found.   Physical Exam  Constitutional: She is oriented to person, place, and time. She appears well-developed and well-nourished. No distress.  HENT:  Head: Normocephalic and atraumatic.  Nose: Nose normal.  Mouth/Throat: Uvula is midline, oropharynx is clear and moist and mucous membranes are normal.  Bilateral cerumen impaction.  Right ear after cerumen removal: TM mildly injected Left ear after cerumen removal: normal  Eyes: Conjunctivae and EOM are normal. Pupils are equal, round, and reactive to light. No scleral icterus.  Neck: Normal range of motion. Neck supple.  Cardiovascular: Normal rate, regular rhythm and normal heart sounds.   Pulmonary/Chest: Effort normal and breath sounds normal. No respiratory distress. She has no wheezes. She has no rales.  Abdominal: Soft. She exhibits no distension. There is no tenderness.  Musculoskeletal: Normal range of motion.  Neurological: She is alert  and oriented to person, place, and time. She has normal strength. No cranial nerve deficit or sensory deficit. Coordination and gait normal.  Speech is clear, alert to person, place and time. Normal coordination. Normal gait.   Skin: Skin is warm and dry. She is not diaphoretic.  Nursing note and vitals reviewed.   ED Course  Procedures (including critical care  time)  Labs Review Labs Reviewed - No data to display  Imaging Review No results found.  Cerumen removal performed by Clemens Catholic, RN  Tympanogram: Normal in Left and Right ear   MDM   1. Benign paroxysmal positional vertigo, unspecified laterality   2. Otalgia, left   3. Cerumen impaction, bilateral     Pt c/o Left otalgia with hx of vertigo, having intermittent episodes of dizziness c/w prior episodes of vertigo.  Bilateral cerumen impaction noted on exam.  After cerumen removal, dizziness started again. Tympanogram-Normal. Otalgia likely due to the cerumen impaction  Pt given zofran . Allowed to rest in exam room. Pt stated she felt better after lying down for a few minutes, feels comfortable being discharged home. Home care instructions provided. F/u with PCP in 3-4 days if not improving, sooner if worsening. Discussed symptoms that warrant emergent care in the ED. Patient verbalized understanding and agreement with treatment plan.     Junius Finner, PA-C 02/13/16 [phone]

## 2016-02-13 NOTE — ED Notes (Signed)
Pt c/o vertigo x 3 days which is almost resolved after taking Dramamine. She has a hx of vertigo. She also c/o LT ear pain x last night.

## 2017-04-13 ENCOUNTER — Encounter: Payer: Self-pay | Admitting: Emergency Medicine

## 2017-04-13 ENCOUNTER — Emergency Department
Admission: EM | Admit: 2017-04-13 | Discharge: 2017-04-13 | Disposition: A | Discharge status: Home / Self Care | Payer: PRIVATE HEALTH INSURANCE | Source: Home / Self Care | Attending: Family Medicine | Admitting: Family Medicine

## 2017-04-13 DIAGNOSIS — N390 Urinary tract infection, site not specified: Secondary | ICD-10-CM

## 2017-04-13 LAB — POCT URINALYSIS DIP (MANUAL ENTRY)
BILIRUBIN UA: NEGATIVE
GLUCOSE UA: NEGATIVE mg/dL
Ketones, POC UA: NEGATIVE mg/dL
NITRITE UA: NEGATIVE
PH UA: 6 (ref 5.0–8.0)
Protein Ur, POC: NEGATIVE mg/dL
Spec Grav, UA: 1.025 (ref 1.010–1.025)
UROBILINOGEN UA: 0.2 U/dL

## 2017-04-13 MED ORDER — NITROFURANTOIN MONOHYD MACRO 100 MG PO CAPS: 100.0000 mg | ORAL_CAPSULE | Freq: Two times a day (BID) | ORAL | 0 refills | Status: DC

## 2017-04-13 NOTE — ED Provider Notes (Signed)
[facility]    CSN: [id number] Arrival date & time: 04/13/17  1403     History   Chief Complaint Chief Complaint  Patient presents with  . Back Pain    HPI Christina Berger is a 41 y.o. female.   The history is provided by the patient.  Dysuria  Pain quality:  Burning Pain severity:  Mild Onset quality:  Gradual Duration:  4 days Timing:  Constant Progression:  Worsening Chronicity:  New Recent urinary tract infections: no   Relieved by:  Phenazopyridine Worsened by:  Nothing Ineffective treatments:  Cranberry juice Urinary symptoms: frequent urination and hesitancy   Urinary symptoms: no discolored urine, no foul-smelling urine, no hematuria and no bladder incontinence   Associated symptoms: nausea   Associated symptoms: no abdominal pain, no fever, no flank pain, no genital lesions, no vaginal discharge and no vomiting   Risk factors: no recurrent urinary tract infections     Past Medical History:  Diagnosis Date  . Vertigo     Patient Active Problem List   Diagnosis Date Noted  . Nonproductive cough 11/17/2011  . ELBOW PAIN 07/22/2011    History reviewed. No pertinent surgical history.  OB History    No data available       Home Medications    Prior to Admission medications   Medication Sig Start Date End Date Taking? Authorizing Provider  nitrofurantoin, macrocrystal-monohydrate, (MACROBID) 100 MG capsule Take 1 capsule (100 mg total) by mouth 2 (two) times daily. Take with food. 04/13/17   Lattie Haw, MD    Family History Family History  Problem Relation Age of Onset  . Diabetes Other   . Hypertension Other   . Hypertension Mother   . Hyperlipidemia Mother   . Hyperlipidemia Father   . Hypertension Father     Social History Social History  Substance Use Topics  . Smoking status: Never Smoker  . Smokeless tobacco: Never Used  . Alcohol use No     Allergies   Amoxicillin   Review of Systems Review of Systems    Constitutional: Negative for fever.  Gastrointestinal: Positive for nausea. Negative for abdominal pain and vomiting.  Genitourinary: Positive for dysuria. Negative for flank pain and vaginal discharge.  All other systems reviewed and are negative.    Physical Exam Triage Vital Signs ED Triage Vitals [04/13/17 1418]  Enc Vitals Group     BP (!) 148/102     Pulse Rate 92     Resp      Temp 98.8 F (37.1 C)     Temp Source Oral     SpO2 100 %     Weight      Height      Head Circumference      Peak Flow      Pain Score      Pain Loc      Pain Edu?      Excl. in GC?    No data found.   Updated Vital Signs BP (!) 148/102 (BP Location: Left Arm) Comment: 152/104  Pulse 92   Temp 98.8 F (37.1 C) (Oral)   SpO2 100%   Visual Acuity Right Eye Distance:   Left Eye Distance:   Bilateral Distance:    Right Eye Near:   Left Eye Near:    Bilateral Near:     Physical Exam Nursing notes and Vital Signs reviewed. Appearance:  Patient appears stated age, and in no acute distress.  Eyes:  Pupils are equal, round, and reactive to light and accomodation.  Extraocular movement is intact.  Conjunctivae are not inflamed   Pharynx:  Normal; moist mucous membranes  Neck:  Supple.  No adenopathy Lungs:  Clear to auscultation.  Breath sounds are equal.  Moving air well. Heart:  Regular rate and rhythm without murmurs, rubs, or gallops.  Abdomen:  Nontender without masses or hepatosplenomegaly.  Bowel sounds are present.  No CVA or flank tenderness.  Extremities:  No edema.  Skin:  No rash present.     UC Treatments / Results  Labs (all labs ordered are listed, but only abnormal results are displayed) Labs Reviewed  POCT URINALYSIS DIP (MANUAL ENTRY) - Abnormal; Notable for the following:       Result Value   Blood, UA trace-lysed (*)    Leukocytes, UA Moderate (2+) (*)    All other components within normal limits  URINE CULTURE    EKG  EKG Interpretation None        Radiology No results found.  Procedures Procedures (including critical care time)  Medications Ordered in UC Medications - No data to display   Initial Impression / Assessment and Plan / UC Course  I have reviewed the triage vital signs and the nursing notes.  Pertinent labs & imaging results that were available during my care of the patient were reviewed by me and considered in my medical decision making (see chart for details).    Urine culture pending. Begin Macrobid  BID for one week. Increase fluid intake. May use non-prescription AZO for about two days, if desired, to decrease urinary discomfort.  If symptoms become significantly worse during the night or over the weekend, proceed to the local emergency room.  Followup with Family Doctor if not improved in one week.     Final Clinical Impressions(s) / UC Diagnoses   Final diagnoses:  Urinary tract infection without hematuria, site unspecified    New Prescriptions New Prescriptions   NITROFURANTOIN, MACROCRYSTAL-MONOHYDRATE, (MACROBID) 100 MG CAPSULE    Take 1 capsule (100 mg total) by mouth 2 (two) times daily. Take with food.     Lattie Haw, MD 04/13/17 [phone]

## 2017-04-13 NOTE — ED Triage Notes (Signed)
Patient presents to [facility] with complaint of lower back pain and urinary pressure, with dysuria x 4 days

## 2017-04-13 NOTE — Discharge Instructions (Signed)
Increase fluid intake. May use non-prescription AZO for about two days, if desired, to decrease urinary discomfort.  If symptoms become significantly worse during the night or over the weekend, proceed to the local emergency room.  

## 2017-04-15 ENCOUNTER — Telehealth: Payer: Self-pay | Admitting: Emergency Medicine

## 2017-04-15 LAB — URINE CULTURE

## 2017-11-30 ENCOUNTER — Other Ambulatory Visit: Payer: Self-pay

## 2017-11-30 ENCOUNTER — Encounter: Payer: Self-pay | Admitting: Emergency Medicine

## 2017-11-30 ENCOUNTER — Emergency Department
Admission: EM | Admit: 2017-11-30 | Discharge: 2017-11-30 | Disposition: A | Discharge status: Home / Self Care | Payer: BLUE CROSS/BLUE SHIELD | Source: Home / Self Care | Attending: Family Medicine | Admitting: Family Medicine

## 2017-11-30 DIAGNOSIS — N3090 Cystitis, unspecified without hematuria: Secondary | ICD-10-CM

## 2017-11-30 LAB — POCT URINALYSIS DIP (MANUAL ENTRY)
GLUCOSE UA: NEGATIVE mg/dL
LEUKOCYTES UA: NEGATIVE
Nitrite, UA: NEGATIVE
UROBILINOGEN UA: 0.2 U/dL
pH, UA: 5.5 (ref 5.0–8.0)

## 2017-11-30 MED ORDER — CIPROFLOXACIN HCL 500 MG PO TABS: 500.0000 mg | ORAL_TABLET | Freq: Two times a day (BID) | ORAL | 0 refills | Status: DC

## 2017-11-30 MED ORDER — PHENAZOPYRIDINE HCL 200 MG PO TABS: 200.0000 mg | ORAL_TABLET | Freq: Three times a day (TID) | ORAL | 0 refills | Status: DC

## 2017-11-30 NOTE — Discharge Instructions (Signed)
Increase fluid intake. °If symptoms become significantly worse during the night or over the weekend, proceed to the local emergency room.  °

## 2017-11-30 NOTE — ED Provider Notes (Signed)
[facility]    CSN: [id number] Arrival date & time: 11/30/17  1418     History   Chief Complaint Chief Complaint  Patient presents with  . Abdominal Pain    HPI Christina Berger is a 41 y.o. female.   Patient awoke today with dysuria and sharp lower abdominal pain.  She has a history of kidney stones and UTI's.  She denies fevers, chills, and sweats.  No nausea/vomiting.     The history is provided by the patient.  Dysuria  Pain quality:  Sharp Pain severity:  Mild Onset quality:  Sudden Duration:  8 hours Timing:  Constant Progression:  Unchanged Chronicity:  Recurrent Recent urinary tract infections: yes   Relieved by:  Nothing Worsened by:  Nothing Ineffective treatments:  None tried Urinary symptoms: frequent urination and hesitancy   Urinary symptoms: no discolored urine, no foul-smelling urine, no hematuria and no bladder incontinence   Associated symptoms: abdominal pain   Associated symptoms: no fever, no flank pain, no genital lesions, no nausea, no vaginal discharge and no vomiting   Risk factors: hx of urolithiasis and recurrent urinary tract infections     Past Medical History:  Diagnosis Date  . Vertigo     Patient Active Problem List   Diagnosis Date Noted  . Nonproductive cough 11/17/2011  . ELBOW PAIN 07/22/2011    History reviewed. No pertinent surgical history.  OB History    No data available       Home Medications    Prior to Admission medications   Medication Sig Start Date End Date Taking? Authorizing Provider  ciprofloxacin (CIPRO) 500 MG tablet Take 1 tablet (500 mg total) by mouth 2 (two) times daily. 11/30/17   Lattie HawBeese, Cosmo Tetreault A, MD  phenazopyridine (PYRIDIUM) 200 MG tablet Take 1 tablet (200 mg total) by mouth 3 (three) times daily. Take after meals. 11/30/17   Lattie HawBeese, Carrine Kroboth A, MD    Family History Family History  Problem Relation Age of Onset  . Diabetes Other   . Hypertension Other   . Hypertension Mother   .  Hyperlipidemia Mother   . Hyperlipidemia Father   . Hypertension Father     Social History Social History   Tobacco Use  . Smoking status: Never Smoker  . Smokeless tobacco: Never Used  Substance Use Topics  . Alcohol use: No  . Drug use: No     Allergies   Amoxicillin   Review of Systems Review of Systems  Constitutional: Negative for fever.  Gastrointestinal: Positive for abdominal pain. Negative for nausea and vomiting.  Genitourinary: Positive for dysuria. Negative for flank pain and vaginal discharge.  All other systems reviewed and are negative.    Physical Exam Triage Vital Signs ED Triage Vitals  Enc Vitals Group     BP 11/30/17 1447 (!) 147/112     Pulse Rate 11/30/17 1447 81     Resp --      Temp 11/30/17 1447 98.4 F (36.9 C)     Temp Source 11/30/17 1447 Oral     SpO2 11/30/17 1447 97 %     Weight --      Height --      Head Circumference --      Peak Flow --      Pain Score 11/30/17 1448 6     Pain Loc --      Pain Edu? --      Excl. in GC? --    No data found.  Updated Vital Signs BP (!) 147/112 (BP Location: Left Arm)   Pulse 81   Temp 98.4 F (36.9 C) (Oral)   SpO2 97%   Visual Acuity Right Eye Distance:   Left Eye Distance:   Bilateral Distance:    Right Eye Near:   Left Eye Near:    Bilateral Near:     Physical Exam Nursing notes and Vital Signs reviewed. Appearance:  Patient appears stated age, and in no acute distress.    Eyes:  Pupils are equal, round, and reactive to light and accomodation.  Extraocular movement is intact.  Conjunctivae are not inflamed   Pharynx:  Normal; moist mucous membranes  Neck:  Supple.  No adenopathy Lungs:  Clear to auscultation.  Breath sounds are equal.  Moving air well. Heart:  Regular rate and rhythm without murmurs, rubs, or gallops.  Abdomen:  Nontender without masses or hepatosplenomegaly.  Bowel sounds are present.  No CVA or flank tenderness.  Extremities:  No edema.  Skin:  No rash  present.     UC Treatments / Results  Labs (all labs ordered are listed, but only abnormal results are displayed) Labs Reviewed  POCT URINALYSIS DIP (MANUAL ENTRY) - Abnormal; Notable for the following components:      Result Value   Clarity, UA cloudy (*)    Bilirubin, UA moderate (*)    Ketones, POC UA trace (5) (*)    Spec Grav, UA >=1.030 (*)    Blood, UA large (*)    Protein Ur, POC =30 (*)    All other components within normal limits  URINE CULTURE    EKG  EKG Interpretation None       Radiology No results found.  Procedures Procedures (including critical care time)  Medications Ordered in UC Medications - No data to display   Initial Impression / Assessment and Plan / UC Course  I have reviewed the triage vital signs and the nursing notes.  Pertinent labs & imaging results that were available during my care of the patient were reviewed by me and considered in my medical decision making (see chart for details).    Begin Cipro 500mg  BID. Rx for Pyridium. Urine culture pending. Increase fluid intake. If symptoms become significantly worse during the night or over the weekend, proceed to the local emergency room.  Followup with Family Doctor if not improved in 5 days.    Final Clinical Impressions(s) / UC Diagnoses   Final diagnoses:  Cystitis    ED Discharge Orders        Ordered    ciprofloxacin (CIPRO) 500 MG tablet  2 times daily     11/30/17 1520    phenazopyridine (PYRIDIUM) 200 MG tablet  3 times daily     11/30/17 1520           Lattie HawBeese, Isley Weisheit A, MD 12/03/17 1322

## 2017-11-30 NOTE — ED Triage Notes (Signed)
[facility] to [facility] with dysuria and lower abdominal Pain. Patient has a hx of kidney stones and kidney infections.

## 2017-12-01 LAB — URINE CULTURE
MICRO NUMBER:: 81384176
RESULT: NO GROWTH
SPECIMEN QUALITY:: ADEQUATE

## 2017-12-03 ENCOUNTER — Telehealth: Payer: Self-pay | Admitting: *Deleted

## 2017-12-03 NOTE — Telephone Encounter (Signed)
Spoke to pt given Ucx results. She reports she is not feeling any better. Advised her to call her PCP tomorrow to schedule a follow up appt. Pt agrees. Clemens Catholichristy Aralyn Nowak, LPN

## 2019-01-05 ENCOUNTER — Emergency Department (INDEPENDENT_AMBULATORY_CARE_PROVIDER_SITE_OTHER)
Admission: EM | Admit: 2019-01-05 | Discharge: 2019-01-05 | Disposition: A | Discharge status: Home / Self Care | Payer: BLUE CROSS/BLUE SHIELD | Source: Home / Self Care | Attending: Family Medicine | Admitting: Family Medicine

## 2019-01-05 ENCOUNTER — Other Ambulatory Visit: Payer: Self-pay

## 2019-01-05 ENCOUNTER — Encounter: Payer: Self-pay | Admitting: Emergency Medicine

## 2019-01-05 ENCOUNTER — Emergency Department (INDEPENDENT_AMBULATORY_CARE_PROVIDER_SITE_OTHER): Payer: BLUE CROSS/BLUE SHIELD

## 2019-01-05 DIAGNOSIS — R110 Nausea: Secondary | ICD-10-CM

## 2019-01-05 DIAGNOSIS — R109 Unspecified abdominal pain: Secondary | ICD-10-CM | POA: Diagnosis not present

## 2019-01-05 DIAGNOSIS — R1012 Left upper quadrant pain: Secondary | ICD-10-CM

## 2019-01-05 HISTORY — DX: Calculus of kidney: N20.0

## 2019-01-05 LAB — POCT URINALYSIS DIP (MANUAL ENTRY)
BILIRUBIN UA: NEGATIVE mg/dL
Bilirubin, UA: NEGATIVE
GLUCOSE UA: NEGATIVE mg/dL
Nitrite, UA: NEGATIVE
Protein Ur, POC: NEGATIVE mg/dL
SPEC GRAV UA: 1.025 (ref 1.010–1.025)
UROBILINOGEN UA: 0.2 U/dL
pH, UA: 5.5 (ref 5.0–8.0)

## 2019-01-05 LAB — POCT CBC W AUTO DIFF (K'VILLE URGENT CARE)

## 2019-01-05 MED ORDER — ONDANSETRON 4 MG PO TBDP
4.0000 mg | ORAL_TABLET | Freq: Once | ORAL | Status: AC
  Administered 2019-01-05: 4 mg via ORAL

## 2019-01-05 MED ORDER — ONDANSETRON 4 MG PO TBDP: ORAL_TABLET | ORAL | 1 refills | Status: AC

## 2019-01-05 NOTE — ED Provider Notes (Signed)
[facility]    CSN: [id number] Arrival date & time: 01/05/19  [id number]     History   Chief Complaint Chief Complaint  Patient presents with  . Flank Pain    HPI Christina Berger is a 43 y.o. female.   Patient developed left flank pain and vague left abdominal pain 3 days ago, with occasional dizziness (sensation of disequilibrium) and nausea.  Her dizziness and nausea have resolved with Dramamine.  She denies dysuria, and has frequency but admits she has been drinking more fluids.  No pelvic pain, hematuria, or fevers, chills, and sweats. She reports that her bowel movements have been less frequent recently. She has a past history of kidney stone that she passed in 2008.  The history is provided by the patient.  Flank Pain  This is a recurrent problem. Episode onset: 3 days ago. The problem occurs constantly. The problem has not changed since onset.Associated symptoms include abdominal pain. Nothing aggravates the symptoms. Nothing relieves the symptoms. Treatments tried: increased fluid intake. The treatment provided no relief.    Past Medical History:  Diagnosis Date  . Kidney stones   . Vertigo     Patient Active Problem List   Diagnosis Date Noted  . Nonproductive cough 11/17/2011  . ELBOW PAIN 07/22/2011    History reviewed. Patient indicates no past surgical history.  OB History    Patient indicates no past OB history.      Home Medications    Prior to Admission medications   Medication Sig Start Date End Date Taking? Authorizing Provider  ondansetron (ZOFRAN ODT) 4 MG disintegrating tablet Take one tab by mouth Q6hr prn nausea.  Dissolve under tongue. 01/05/19   Lattie HawBeese, Diannie Willner A, MD    Family History Family History  Problem Relation Age of Onset  . Diabetes Other   . Hypertension Other   . Hypertension Mother   . Hyperlipidemia Mother   . Hyperlipidemia Father   . Hypertension Father     Social History Social History   Tobacco Use  . Smoking  status: Never Smoker  . Smokeless tobacco: Never Used  Substance Use Topics  . Alcohol use: No  . Drug use: No     Allergies   Amoxicillin   Review of Systems Review of Systems  Constitutional: Negative for activity change, appetite change, chills, diaphoresis, fatigue and fever.  HENT: Negative.   Eyes: Negative.   Respiratory: Negative.   Cardiovascular: Negative.   Gastrointestinal: Positive for abdominal pain.  Genitourinary: Positive for flank pain, frequency and urgency. Negative for decreased urine volume, difficulty urinating, dysuria, hematuria and pelvic pain.  All other systems reviewed and are negative.    Physical Exam Triage Vital Signs ED Triage Vitals  Enc Vitals Group     BP 01/05/19 0832 (!) 155/117     Pulse Rate 01/05/19 0832 99     Resp --      Temp 01/05/19 0832 98.2 F (36.8 C)     Temp Source 01/05/19 0832 Oral     SpO2 01/05/19 0832 100 %     Weight 01/05/19 0833 140 lb (63.5 kg)     Height 01/05/19 0833 5\' 3"  (1.6 m)     Head Circumference --      Peak Flow --      Pain Score 01/05/19 0832 3     Pain Loc --      Pain Edu? --      Excl. in GC? --  No data found.  Updated Vital Signs BP (!) 155/117 (BP Location: Right Arm)   Pulse 99   Temp 98.2 F (36.8 C) (Oral)   Ht 5\' 3"  (1.6 m)   Wt 63.5 kg   LMP 12/07/2018   SpO2 100%   BMI 24.80 kg/m   Visual Acuity Right Eye Distance:   Left Eye Distance:   Bilateral Distance:    Right Eye Near:   Left Eye Near:    Bilateral Near:     Physical Exam Nursing notes and Vital Signs reviewed. Appearance:  Patient appears stated age, and in no acute distress Eyes:  Pupils are equal, round, and reactive to light and accomodation.  Extraocular movement is intact.  Conjunctivae are not inflamed  Ears:  Canals normal.  Tympanic membranes normal.  Nose:  Normal turbinates Pharynx:  Normal Neck:  Supple.  No adenopathy Lungs:  Clear to auscultation.  Breath sounds are equal.  Moving air  well. Heart:  Regular rate and rhythm without murmurs, rubs, or gallops.  Abdomen:  Mild tenderness left flank extending anteriorly without masses or hepatosplenomegaly.  Bowel sounds are present.  No CVA or flank tenderness.  Extremities:  No edema.  Skin:  No rash present.    UC Treatments / Results  Labs (all labs ordered are listed, but only abnormal results are displayed) Labs Reviewed  POCT URINALYSIS DIP (MANUAL ENTRY) - Abnormal; Notable for the following components:      Result Value   Clarity, UA cloudy (*)    Blood, UA trace-intact (*)    Leukocytes, UA Small (1+) (*)    All other components within normal limits  URINE CULTURE  COMPLETE METABOLIC PANEL WITH GFR  TSH  POCT CBC W AUTO DIFF ([facility]):  WBC 5.6; LY 24.8; MO 5.8; GR 69.4; Hgb 14.0; Platelets 248     EKG None  Radiology Dg Abdomen 1 View  Result Date: 01/05/2019 CLINICAL DATA:  Left flank and mid abdominal pain and bloating EXAM: ABDOMEN - 1 VIEW COMPARISON:  None. FINDINGS: No dilated small bowel loops. Mild colonic stool volume. No evidence of pneumatosis or pneumoperitoneum. No radiopaque urolithiasis. Visualized osseous structures appear intact. IMPRESSION: Nonobstructive bowel gas pattern.  No radiopaque urolithiasis. Electronically Signed   By: Delbert Phenix M.D.   On: 01/05/2019 09:58    Procedures Procedures (including critical care time)  Medications Ordered in UC Medications  ondansetron (ZOFRAN-ODT) disintegrating tablet 4 mg (4 mg Oral Given 01/05/19 0933)    Initial Impression / Assessment and Plan / UC Course  I have reviewed the triage vital signs and the nursing notes.  Pertinent labs & imaging results that were available during my care of the patient were reviewed by me and considered in my medical decision making (see chart for details).    Normal CBC reassuring.  Nephrolithiasis unlikely with minimal hematuria present.  Urine culture pending. Check CMP. Note mild  colonic stool volume on KUB;  Will check TSH (note normal TSH pm 10/25/15:  1.310) ?constipation as cause of abdominal pain and nausea.  Administered Zofran ODT 4mg  PO; given Rx for same.  Followup with Family Doctor if not improved in one week.    Final Clinical Impressions(s) / UC Diagnoses   Final diagnoses:  Nausea without vomiting  Abdominal pain, left upper quadrant  Left flank pain     Discharge Instructions     Begin clear liquids for about 18 to 24 hours, then may begin a BRAT diet (  Bananas, Rice, Applesauce, Toast) when nausea and dizziness resolved.  Then gradually advance to a regular diet as tolerated.   Recommend taking daily Miralax, approximately 1/4 to 1/2 capful mixed in 4 to 8 ounces of water until bowel movements are regular. Recommend increasing natural fiber in diet.    If symptoms become significantly worse during the night or over the weekend, proceed to the local emergency room.       ED Prescriptions    Medication Sig Dispense Auth. Provider   ondansetron (ZOFRAN ODT) 4 MG disintegrating tablet Take one tab by mouth Q6hr prn nausea.  Dissolve under tongue. 12 tablet Lattie HawBeese, Shavontae Gibeault A, MD         Lattie HawBeese, Soriya Worster A, MD 01/05/19 1256

## 2019-01-05 NOTE — Discharge Instructions (Addendum)
Begin clear liquids for about 18 to 24 hours, then may begin a BRAT diet (Bananas, Rice, Applesauce, Toast) when nausea and dizziness resolved.  Then gradually advance to a regular diet as tolerated.   Recommend taking daily Miralax, approximately 1/4 to 1/2 capful mixed in 4 to 8 ounces of water until bowel movements are regular. Recommend increasing natural fiber in diet.    If symptoms become significantly worse during the night or over the weekend, proceed to the local emergency room.

## 2019-01-05 NOTE — ED Triage Notes (Signed)
Left flank pain x 4 days, nausea, dizziness

## 2019-01-06 LAB — COMPLETE METABOLIC PANEL WITH GFR
AG RATIO: 2.7 (calc) — AB (ref 1.0–2.5)
ALT: 13 U/L (ref 6–29)
AST: 13 U/L (ref 10–30)
Albumin: 4.8 g/dL (ref 3.6–5.1)
Alkaline phosphatase (APISO): 62 U/L (ref 33–115)
BUN: 12 mg/dL (ref 7–25)
CALCIUM: 9.8 mg/dL (ref 8.6–10.2)
CO2: 23 mmol/L (ref 20–32)
Chloride: 108 mmol/L (ref 98–110)
Creat: 0.71 mg/dL (ref 0.50–1.10)
GFR, EST NON AFRICAN AMERICAN: 107 mL/min/{1.73_m2} (ref >=60)
GFR, Est African American: 123 mL/min/{1.73_m2} (ref >=60)
GLOBULIN: 1.8 g/dL — AB (ref 1.9–3.7)
Glucose, Bld: 93 mg/dL (ref 65–99)
POTASSIUM: 4 mmol/L (ref 3.5–5.3)
Sodium: 141 mmol/L (ref 135–146)
TOTAL PROTEIN: 6.6 g/dL (ref 6.1–8.1)
Total Bilirubin: 0.7 mg/dL (ref 0.2–1.2)

## 2019-01-06 LAB — URINE CULTURE
MICRO NUMBER: 46384
SPECIMEN QUALITY:: ADEQUATE

## 2019-01-06 LAB — TSH: TSH: 1.58 mIU/L

## 2019-01-07 ENCOUNTER — Telehealth: Payer: Self-pay | Admitting: *Deleted

## 2019-01-07 MED ORDER — CIPROFLOXACIN HCL 500 MG PO TABS: 500.0000 mg | ORAL_TABLET | Freq: Two times a day (BID) | ORAL | 0 refills | Status: AC

## 2019-01-07 NOTE — Telephone Encounter (Signed)
Spoke to pt given lab results. Rx for Cipro sent to her pharmacy [place]
